# Patient Record
Sex: Male | Born: 1960 | Race: White | Hispanic: No | Marital: Married | Smoking: Never smoker
Health system: Southern US, Community
[De-identification: ages and names within clinical notes are randomized; demographics above are authoritative.]

## PROBLEM LIST (undated history)

## (undated) DIAGNOSIS — T7840XA Allergy, unspecified, initial encounter: Secondary | ICD-10-CM

## (undated) DIAGNOSIS — G5600 Carpal tunnel syndrome, unspecified upper limb: Secondary | ICD-10-CM

## (undated) DIAGNOSIS — J45909 Unspecified asthma, uncomplicated: Secondary | ICD-10-CM

## (undated) DIAGNOSIS — K219 Gastro-esophageal reflux disease without esophagitis: Secondary | ICD-10-CM

## (undated) HISTORY — DX: Gastro-esophageal reflux disease without esophagitis: K21.9

## (undated) HISTORY — DX: Unspecified asthma, uncomplicated: J45.909

## (undated) HISTORY — DX: Allergy, unspecified, initial encounter: T78.40XA

## (undated) HISTORY — DX: Carpal tunnel syndrome, unspecified upper limb: G56.00

---

## 2010-08-04 ENCOUNTER — Ambulatory Visit: Payer: Self-pay

## 2010-11-16 ENCOUNTER — Ambulatory Visit: Payer: Self-pay | Admitting: Internal Medicine

## 2012-05-05 ENCOUNTER — Ambulatory Visit: Payer: Self-pay | Admitting: Unknown Physician Specialty

## 2015-03-04 ENCOUNTER — Other Ambulatory Visit: Payer: Self-pay | Admitting: Specialist

## 2015-03-04 DIAGNOSIS — R911 Solitary pulmonary nodule: Secondary | ICD-10-CM

## 2015-03-07 ENCOUNTER — Ambulatory Visit: Admission: RE | Admit: 2015-03-07 | Payer: 59 | Source: Ambulatory Visit

## 2015-03-11 ENCOUNTER — Ambulatory Visit
Admission: RE | Admit: 2015-03-11 | Discharge: 2015-03-11 | Disposition: A | Discharge status: Home / Self Care | Payer: 59 | Source: Ambulatory Visit | Attending: Specialist | Admitting: Specialist

## 2015-03-11 DIAGNOSIS — R0902 Hypoxemia: Secondary | ICD-10-CM | POA: Diagnosis not present

## 2015-03-11 DIAGNOSIS — R05 Cough: Secondary | ICD-10-CM | POA: Insufficient documentation

## 2015-03-11 DIAGNOSIS — R0602 Shortness of breath: Secondary | ICD-10-CM | POA: Diagnosis not present

## 2015-03-11 DIAGNOSIS — R911 Solitary pulmonary nodule: Secondary | ICD-10-CM | POA: Diagnosis not present

## 2015-03-20 ENCOUNTER — Other Ambulatory Visit: Payer: Self-pay | Admitting: Specialist

## 2015-03-20 DIAGNOSIS — R918 Other nonspecific abnormal finding of lung field: Secondary | ICD-10-CM

## 2015-09-17 ENCOUNTER — Ambulatory Visit: Payer: 59 | Attending: Specialist

## 2015-10-10 ENCOUNTER — Other Ambulatory Visit: Payer: Self-pay | Admitting: Specialist

## 2015-10-10 DIAGNOSIS — J479 Bronchiectasis, uncomplicated: Secondary | ICD-10-CM

## 2015-10-10 DIAGNOSIS — R918 Other nonspecific abnormal finding of lung field: Secondary | ICD-10-CM

## 2015-10-16 ENCOUNTER — Ambulatory Visit: Payer: 59 | Attending: Specialist

## 2016-01-01 ENCOUNTER — Encounter: Payer: Self-pay | Admitting: Physician Assistant

## 2016-01-01 ENCOUNTER — Encounter: Payer: Self-pay | Admitting: *Deleted

## 2016-01-01 ENCOUNTER — Ambulatory Visit (INDEPENDENT_AMBULATORY_CARE_PROVIDER_SITE_OTHER): Payer: 59 | Admitting: Physician Assistant

## 2016-01-01 VITALS — BP 110/70 | HR 65 | Temp 97.6°F | Resp 16 | Ht 69.0 in | Wt 164.0 lb

## 2016-01-01 DIAGNOSIS — J4520 Mild intermittent asthma, uncomplicated: Secondary | ICD-10-CM

## 2016-01-01 DIAGNOSIS — J302 Other seasonal allergic rhinitis: Secondary | ICD-10-CM

## 2016-01-01 DIAGNOSIS — J4530 Mild persistent asthma, uncomplicated: Secondary | ICD-10-CM | POA: Insufficient documentation

## 2016-01-01 DIAGNOSIS — J0100 Acute maxillary sinusitis, unspecified: Secondary | ICD-10-CM

## 2016-01-01 DIAGNOSIS — J3089 Other allergic rhinitis: Secondary | ICD-10-CM | POA: Insufficient documentation

## 2016-01-01 DIAGNOSIS — K219 Gastro-esophageal reflux disease without esophagitis: Secondary | ICD-10-CM | POA: Insufficient documentation

## 2016-01-01 MED ORDER — ALBUTEROL SULFATE (2.5 MG/3ML) 0.083% IN NEBU: 2.5000 mg | INHALATION_SOLUTION | Freq: Four times a day (QID) | RESPIRATORY_TRACT | Status: AC | PRN

## 2016-01-01 MED ORDER — AMOXICILLIN-POT CLAVULANATE 875-125 MG PO TABS
1.0000 | ORAL_TABLET | Freq: Two times a day (BID) | ORAL | Status: DC
Start: 2016-01-01 — End: 2016-09-09

## 2016-01-01 MED ORDER — FLUTICASONE PROPIONATE 50 MCG/ACT NA SUSP: 2.0000 | Freq: Every day | NASAL | Status: AC

## 2016-01-01 NOTE — Progress Notes (Signed)
Patient: Danny Ramos Male    DOB: 09/04/1961   55 y.o.   MRN: [id number] Visit Date: 01/01/2016  Today's Provider: Margaretann Loveless, PA-C   Chief Complaint  Patient presents with  . Sinusitis   Subjective:    Sinusitis This is a new ( ) problem. The current episode started 1 to 4 weeks ago (2 weeks). The problem has been gradually improving since onset. The maximum temperature recorded prior to his arrival was 101 - 101.9 F. The pain is mild. Associated symptoms include chills, congestion, coughing, ear pain, headaches, shortness of breath, sinus pressure, sneezing and a sore throat. Pertinent negatives include no diaphoresis, hoarse voice, neck pain or swollen glands. (Has tube in right ear) Past treatments include oral decongestants and spray decongestants (flonase, tylenol cold and flu). The treatment provided mild relief.   Sinus pressure for 2 weeks. Chills, sweats, and fever since Saturday 12/27/2015. Right ear feels congested, headaches, sob, sore throat.   No Known Allergies Previous Medications   ASPIRIN 81 MG TABLET    Take 1 tablet by mouth daily.   FLUTICASONE (FLONASE) 50 MCG/ACT NASAL SPRAY    Place into the nose.   FLUTICASONE-SALMETEROL (ADVAIR DISKUS) 250-50 MCG/DOSE AEPB    Inhale into the lungs.   SPIRIVA HANDIHALER 18 MCG INHALATION CAPSULE        Review of Systems  Constitutional: Positive for chills. Negative for fever, diaphoresis and appetite change.  HENT: Positive for congestion, ear pain, postnasal drip, sinus pressure, sneezing and sore throat. Negative for hoarse voice.   Respiratory: Positive for cough and shortness of breath. Negative for chest tightness and wheezing.   Cardiovascular: Negative for chest pain and palpitations.  Gastrointestinal: Negative for nausea, vomiting and abdominal pain.  Musculoskeletal: Negative for neck pain.  Neurological: Positive for headaches. Negative for dizziness.    Social History  Substance Use Topics   . Smoking status: Never Smoker   . Smokeless tobacco: Not on file  . Alcohol Use: 0.0 oz/week    0 Standard drinks or equivalent per week   Objective:   BP 110/70 mmHg  Pulse 65  Temp(Src) 97.6 F (36.4 C) (Oral)  Resp 16  Ht  (1.753 m)  Wt 164 lb (74.39 kg)  BMI 24.21 kg/m2  SpO2 96%  Physical Exam  Constitutional: He appears well-developed and well-nourished. No distress.  HENT:  Head: Normocephalic and atraumatic.  Right Ear: Hearing, external ear and ear canal normal. Tympanic membrane is not erythematous and not bulging. No middle ear effusion (small cerumen blocking lower aspect of TM so tube was not visualized).  Left Ear: Hearing, external ear and ear canal normal. Tympanic membrane is not erythematous and not bulging. A middle ear effusion is present.  Nose: Mucosal edema and rhinorrhea present. Right sinus exhibits maxillary sinus tenderness. Right sinus exhibits no frontal sinus tenderness. Left sinus exhibits maxillary sinus tenderness. Left sinus exhibits no frontal sinus tenderness.  Mouth/Throat: Uvula is midline, oropharynx is clear and moist and mucous membranes are normal. No oropharyngeal exudate, posterior oropharyngeal edema or posterior oropharyngeal erythema (postnasal drainage noted-thick).  Eyes: Conjunctivae and EOM are normal. Pupils are equal, round, and reactive to light. Right eye exhibits no discharge. Left eye exhibits no discharge.  Neck: Normal range of motion. Neck supple. No tracheal deviation present. No Brudzinski's sign and no Kernig's sign noted. No thyromegaly present.  Cardiovascular: Normal rate, regular rhythm and normal heart sounds.  Exam reveals no gallop  and no friction rub.   No murmur heard. Pulmonary/Chest: Effort normal and breath sounds normal. No stridor. No respiratory distress. He has no wheezes. He has no rales.  Lymphadenopathy:    He has no cervical adenopathy.  Skin: Skin is warm and dry. He is not diaphoretic.  Vitals  reviewed.       Assessment & Plan:     1. Acute maxillary sinusitis, recurrence not specified Worsening symptoms over 2 weeks that has not responded to OTC medications. Will give augmentin as below. Continue allergy medications as prescribed. Stay well hydrated and get plenty of rest. Call if no improvement in symptoms.  - amoxicillin-clavulanate (AUGMENTIN) 875-125 MG tablet; Take 1 tablet by mouth 2 (two) times daily.  Dispense: 20 tablet; Refill: 0  2. Asthma, mild intermittent, uncomplicated Stable. Diagnosis pulled for medication refill. Continue current medical treatment plan. - albuterol (PROVENTIL HFA;VENTOLIN HFA) 108 (90 Base) MCG/ACT inhaler; Inhale 2 puffs into the lungs every 6 (six) hours as needed for wheezing or shortness of breath.  Dispense: 1 Inhaler; Refill: 0 - albuterol (PROVENTIL) (2.5 MG/3ML) 0.083% nebulizer solution; Take 3 mLs (2.5 mg total) by nebulization every 6 (six) hours as needed for wheezing or shortness of breath.  Dispense: 75 mL; Refill: 12  3. Other seasonal allergic rhinitis Stable. Diagnosis pulled for medication refill. Continue current medical treatment plan. - fluticasone (FLONASE) 50 MCG/ACT nasal spray; Place 2 sprays into both nostrils daily.  Dispense: 16 g; Refill: 6       Margaretann LovelessJennifer M Rana Adorno, PA-C  [facility]

## 2016-01-01 NOTE — Patient Instructions (Signed)

## 2016-02-11 ENCOUNTER — Telehealth: Payer: Self-pay

## 2016-02-11 ENCOUNTER — Other Ambulatory Visit: Payer: Self-pay | Admitting: Family Medicine

## 2016-02-11 DIAGNOSIS — J4520 Mild intermittent asthma, uncomplicated: Secondary | ICD-10-CM

## 2016-02-11 DIAGNOSIS — J4530 Mild persistent asthma, uncomplicated: Secondary | ICD-10-CM

## 2016-02-11 MED ORDER — FLUTICASONE-SALMETEROL 250-50 MCG/DOSE IN AEPB: 1.0000 | INHALATION_SPRAY | Freq: Two times a day (BID) | RESPIRATORY_TRACT | Status: DC

## 2016-02-11 MED ORDER — ALBUTEROL SULFATE HFA 108 (90 BASE) MCG/ACT IN AERS: 2.0000 | INHALATION_SPRAY | Freq: Four times a day (QID) | RESPIRATORY_TRACT | Status: DC | PRN

## 2016-02-11 MED ORDER — ALBUTEROL SULFATE HFA 108 (90 BASE) MCG/ACT IN AERS: 2.0000 | INHALATION_SPRAY | Freq: Four times a day (QID) | RESPIRATORY_TRACT | Status: AC | PRN

## 2016-02-11 NOTE — Telephone Encounter (Signed)
Patient is requesting refill on Advair and ProAir rescue inhaler. He is completely out of the Advair and would like it sent to local pharmacy. He would also like a refill sent into mail order pharmacy if possible. Thanks!

## 2016-09-09 ENCOUNTER — Ambulatory Visit (INDEPENDENT_AMBULATORY_CARE_PROVIDER_SITE_OTHER): Payer: 59 | Admitting: Family Medicine

## 2016-09-09 ENCOUNTER — Encounter: Payer: Self-pay | Admitting: Family Medicine

## 2016-09-09 ENCOUNTER — Other Ambulatory Visit: Payer: Self-pay | Admitting: Family Medicine

## 2016-09-09 VITALS — BP 104/72 | HR 72 | Temp 97.8°F | Resp 16 | Wt 165.0 lb

## 2016-09-09 DIAGNOSIS — J069 Acute upper respiratory infection, unspecified: Secondary | ICD-10-CM | POA: Diagnosis not present

## 2016-09-09 DIAGNOSIS — J441 Chronic obstructive pulmonary disease with (acute) exacerbation: Secondary | ICD-10-CM

## 2016-09-09 DIAGNOSIS — B9789 Other viral agents as the cause of diseases classified elsewhere: Secondary | ICD-10-CM

## 2016-09-09 DIAGNOSIS — J449 Chronic obstructive pulmonary disease, unspecified: Secondary | ICD-10-CM | POA: Insufficient documentation

## 2016-09-09 MED ORDER — AMOXICILLIN-POT CLAVULANATE 875-125 MG PO TABS: 1.0000 | ORAL_TABLET | Freq: Two times a day (BID) | ORAL | 0 refills | Status: DC

## 2016-09-09 MED ORDER — PREDNISONE 20 MG PO TABS
ORAL_TABLET | ORAL | 0 refills | Status: DC
Start: 2016-09-09 — End: 2016-09-09

## 2016-09-09 MED ORDER — PREDNISONE 20 MG PO TABS: ORAL_TABLET | ORAL | 0 refills | Status: DC

## 2016-09-09 NOTE — Progress Notes (Signed)
Subjective:     Patient ID: Danny NeighborsJohn A Ramos, male   DOB: 06-25-1961, 55 y.o.   MRN: [id number]  HPI  Chief Complaint  Patient presents with  . Sinus Problem    Patient comes in office today with concerns of sinus congestion and cough since 12/15. Patient reports that he has been wheezing, sneezing, having chills and difficulty breathing. Patient reports that he has taken otc Tylenol, Tylenol sinus and Robitussin  Reports increased thick, purulent sputum and shortness of breath. Has started using his nebulizer.   Review of Systems     Objective:   Physical Exam  Constitutional: He appears well-developed and well-nourished. No distress.  Ears: T.M's intact without inflammation Sinuses: mild maxillary sinus tenderness Throat: no tonsillar enlargement or exudate Neck: no cervical adenopathy Lungs: basilar expiratory wheezes     Assessment:    1. Viral upper respiratory tract infectio  2. COPD exacerbation (HCC) - amoxicillin-clavulanate (AUGMENTIN) 875-125 MG tablet; Take 1 tablet by mouth 2 (two) times daily.  Dispense: 20 tablet; Refill: 0 - predniSONE (DELTASONE) 20 MG tablet; Take two pills twice daily for 5 days  Dispense: 10 tablet; Refill: 0    Plan:    Continue expectorant and rescue inhalers.

## 2016-09-09 NOTE — Patient Instructions (Signed)
Continue with rescue inhaler/nebulizer at least twice daily while ill. Continue with expectorant

## 2017-04-13 ENCOUNTER — Other Ambulatory Visit: Payer: Self-pay | Admitting: Family Medicine

## 2017-04-13 DIAGNOSIS — J4530 Mild persistent asthma, uncomplicated: Secondary | ICD-10-CM

## 2018-05-11 ENCOUNTER — Other Ambulatory Visit: Payer: Self-pay | Admitting: Family Medicine

## 2018-05-11 DIAGNOSIS — J4530 Mild persistent asthma, uncomplicated: Secondary | ICD-10-CM

## 2018-05-11 MED ORDER — FLUTICASONE-SALMETEROL 250-50 MCG/DOSE IN AEPB: INHALATION_SPRAY | RESPIRATORY_TRACT | 3 refills | Status: DC

## 2018-11-03 ENCOUNTER — Ambulatory Visit: Payer: 59 | Admitting: Family Medicine

## 2018-11-03 ENCOUNTER — Encounter: Payer: Self-pay | Admitting: Family Medicine

## 2018-11-03 ENCOUNTER — Other Ambulatory Visit: Payer: Self-pay | Admitting: Family Medicine

## 2018-11-03 ENCOUNTER — Other Ambulatory Visit: Payer: Self-pay

## 2018-11-03 VITALS — BP 110/70 | HR 71 | Temp 97.6°F | Ht 69.0 in | Wt 165.8 lb

## 2018-11-03 DIAGNOSIS — Z87898 Personal history of other specified conditions: Secondary | ICD-10-CM | POA: Diagnosis not present

## 2018-11-03 DIAGNOSIS — J4530 Mild persistent asthma, uncomplicated: Secondary | ICD-10-CM

## 2018-11-03 DIAGNOSIS — J3089 Other allergic rhinitis: Secondary | ICD-10-CM

## 2018-11-03 DIAGNOSIS — J449 Chronic obstructive pulmonary disease, unspecified: Secondary | ICD-10-CM

## 2018-11-03 DIAGNOSIS — M722 Plantar fascial fibromatosis: Secondary | ICD-10-CM

## 2018-11-03 DIAGNOSIS — R5383 Other fatigue: Secondary | ICD-10-CM

## 2018-11-03 DIAGNOSIS — Z125 Encounter for screening for malignant neoplasm of prostate: Secondary | ICD-10-CM

## 2018-11-03 NOTE — Patient Instructions (Signed)
You will get a call about the allergist and podiatry appointments. We will call about lab and CT scan results. Consider getting a colonoscopy for colon cancer screening. Consider establishing with either Adriana Pollak P.A. or Shirlee Latch M.D.

## 2018-11-03 NOTE — Progress Notes (Signed)
  Subjective:     Patient ID: Danny Ramos, male   DOB: 03/22/1961, 58 y.o.   MRN: [id number] Chief Complaint  Patient presents with  . Medication Refill  . right foot pain    no injury just started having pain for several months now   HPI Last seen here in 2017, he has also been lost to f/u by his pulmonary MD, Dr. Meredeth Ide. Has not had f/u of pulmonary nodules noted on chest CT 2016. Reports fatigue and wishes further evaluation. Also has been using nsaid's and shoe inserts for foot pain c/w plantar fasciitis for 3 months without improvement. Review of Systems  Respiratory: Negative for shortness of breath.        Reports control of asthma and COPD with his maintenance medication. States he has poor control of allergies and wishes further evaluation.  Cardiovascular: Negative for chest pain and palpitations.       Objective:   Physical Exam Constitutional:      General: He is not in acute distress.    Appearance: He is not ill-appearing.  Cardiovascular:     Rate and Rhythm: Normal rate and regular rhythm.     Pulses:          Dorsalis pedis pulses are 2+ on the right side.       Posterior tibial pulses are 2+ on the right side.  Musculoskeletal:     Right lower leg: No edema.     Left lower leg: No edema.     Comments: Right plantar foot tender at midfoot. Ankle ligaments stable. DF/PF 5/5  Neurological:     Mental Status: He is alert.        Assessment:    1. Mild persistent asthma without complication: stable  2. Chronic obstructive pulmonary disease, unspecified COPD type (HCC): stable  3. History of multiple pulmonary nodules - CT CHEST NODULE FOLLOW UP LOW DOSE W/O; Future  4. Perennial allergic rhinitis - Ambulatory referral to Allergy  5. Plantar fasciitis of right foot - Ambulatory referral to Podiatry  6. Fatigue, unspecified type - CBC with Differential/Platelet - T4, free - TSH - Comprehensive metabolic panel  7. Screening for prostate  cancer - PSA    Plan:    Further f/u pending lab and scan results. Discussed establishing with Danny Ramos or Danny Ramos due to my retirement.

## 2018-11-04 LAB — CBC WITH DIFFERENTIAL/PLATELET
BASOS ABS: 0 10*3/uL (ref 0.0–0.2)
BASOS: 1 %
EOS (ABSOLUTE): 0.2 10*3/uL (ref 0.0–0.4)
Eos: 2 %
Hematocrit: 42.3 % (ref 37.5–51.0)
Hemoglobin: 14.5 g/dL (ref 13.0–17.7)
IMMATURE GRANS (ABS): 0 10*3/uL (ref 0.0–0.1)
IMMATURE GRANULOCYTES: 0 %
Lymphocytes Absolute: 1.5 10*3/uL (ref 0.7–3.1)
Lymphs: 22 %
MCH: 30.3 pg (ref 26.6–33.0)
MCHC: 34.3 g/dL (ref 31.5–35.7)
MCV: 89 fL (ref 79–97)
Monocytes Absolute: 0.7 10*3/uL (ref 0.1–0.9)
Monocytes: 10 %
Neutrophils Absolute: 4.5 10*3/uL (ref 1.4–7.0)
Neutrophils: 65 %
PLATELETS: 257 10*3/uL (ref 150–450)
RBC: 4.78 x10E6/uL (ref 4.14–5.80)
RDW: 12.1 % (ref 11.6–15.4)
WBC: 6.9 10*3/uL (ref 3.4–10.8)

## 2018-11-04 LAB — COMPREHENSIVE METABOLIC PANEL
ALK PHOS: 78 IU/L (ref 39–117)
ALT: 21 IU/L (ref 0–44)
AST: 21 IU/L (ref 0–40)
Albumin/Globulin Ratio: 1.4 (ref 1.2–2.2)
Albumin: 4.2 g/dL (ref 3.8–4.9)
BILIRUBIN TOTAL: 0.9 mg/dL (ref 0.0–1.2)
BUN/Creatinine Ratio: 16 (ref 9–20)
BUN: 12 mg/dL (ref 6–24)
CHLORIDE: 101 mmol/L (ref 96–106)
CO2: 24 mmol/L (ref 20–29)
CREATININE: 0.77 mg/dL (ref 0.76–1.27)
Calcium: 9.5 mg/dL (ref 8.7–10.2)
GFR calc Af Amer: 117 mL/min/{1.73_m2} (ref >59)
GFR calc non Af Amer: 101 mL/min/{1.73_m2} (ref >59)
GLUCOSE: 82 mg/dL (ref 65–99)
Globulin, Total: 3 g/dL (ref 1.5–4.5)
Potassium: 4.4 mmol/L (ref 3.5–5.2)
Sodium: 140 mmol/L (ref 134–144)
Total Protein: 7.2 g/dL (ref 6.0–8.5)

## 2018-11-04 LAB — T4, FREE: FREE T4: 1.16 ng/dL (ref 0.82–1.77)

## 2018-11-04 LAB — TSH: TSH: 2.44 u[IU]/mL (ref 0.450–4.500)

## 2018-11-04 LAB — PSA: PROSTATE SPECIFIC AG, SERUM: 0.4 ng/mL (ref 0.0–4.0)

## 2018-11-06 ENCOUNTER — Telehealth: Payer: Self-pay

## 2018-11-06 NOTE — Telephone Encounter (Signed)
-----   Message from Anola Gurney, [place] sent at 11/06/2018  7:24 AM EST ----- Labs are all great.  Your fatigue may be contributed to by poorly controlled allergies so follow up with the allergist. Also would proceed with the chest CT scan as ordered.

## 2018-11-06 NOTE — Telephone Encounter (Signed)
Patient has been advised. KW 

## 2018-11-06 NOTE — Telephone Encounter (Signed)
[id number] 

## 2018-11-06 NOTE — Telephone Encounter (Signed)
Tried calling patient. Left message to call back. 

## 2018-11-06 NOTE — Telephone Encounter (Signed)
Pt returned missed call.  Please try pt back.  Thanks, [employer]

## 2018-11-15 ENCOUNTER — Ambulatory Visit
Admission: RE | Admit: 2018-11-15 | Discharge: 2018-11-15 | Disposition: A | Discharge status: Home / Self Care | Payer: 59 | Source: Ambulatory Visit | Attending: Family Medicine | Admitting: Family Medicine

## 2018-11-15 DIAGNOSIS — Z87898 Personal history of other specified conditions: Secondary | ICD-10-CM | POA: Insufficient documentation

## 2018-11-16 ENCOUNTER — Telehealth: Payer: Self-pay

## 2018-11-16 NOTE — Telephone Encounter (Signed)
Patient advised.KW 

## 2018-11-16 NOTE — Telephone Encounter (Signed)
-----   Message from Anola Gurney, [place] sent at 11/16/2018  9:37 AM EST ----- Nodules have resolved. Would recommend re-establish with your pulmonary doctor due to your emphyesema.

## 2018-12-01 ENCOUNTER — Ambulatory Visit (INDEPENDENT_AMBULATORY_CARE_PROVIDER_SITE_OTHER): Payer: 59

## 2018-12-01 ENCOUNTER — Ambulatory Visit: Payer: 59 | Admitting: Podiatry

## 2018-12-01 ENCOUNTER — Encounter: Payer: Self-pay | Admitting: Podiatry

## 2018-12-01 ENCOUNTER — Telehealth: Payer: Self-pay | Admitting: *Deleted

## 2018-12-01 ENCOUNTER — Other Ambulatory Visit: Payer: Self-pay

## 2018-12-01 DIAGNOSIS — M722 Plantar fascial fibromatosis: Secondary | ICD-10-CM

## 2018-12-01 MED ORDER — METHYLPREDNISOLONE 4 MG PO TBPK: ORAL_TABLET | ORAL | 0 refills | Status: DC

## 2018-12-01 MED ORDER — METHYLPREDNISOLONE 4 MG PO TBPK: ORAL_TABLET | ORAL | 0 refills | Status: AC

## 2018-12-01 MED ORDER — MELOXICAM 15 MG PO TABS: 15.0000 mg | ORAL_TABLET | Freq: Every day | ORAL | 1 refills | Status: DC

## 2018-12-01 NOTE — Telephone Encounter (Signed)
Left message ordering Medrol Dosepak 4mg .

## 2018-12-04 NOTE — Progress Notes (Signed)
   Subjective: 58 year old male presenting today as a new patient with a chief complaint of stabbing pain to the arch of the right foot that began 3 months ago. He reports feeling like he is walking on pins and needles. Walking increases the pain. He has not done anything for treatment. Patient is here for further evaluation and treatment.   Past Medical History:  Diagnosis Date  . Allergy   . Asthma   . Carpal tunnel syndrome   . GERD (gastroesophageal reflux disease)      Objective: Physical Exam General: The patient is alert and oriented x3 in no acute distress.  Dermatology: Skin is warm, dry and supple bilateral lower extremities. Negative for open lesions or macerations bilateral.   Vascular: Dorsalis Pedis and Posterior Tibial pulses palpable bilateral.  Capillary fill time is immediate to all digits.  Neurological: Epicritic and protective threshold intact bilateral.   Musculoskeletal: Tenderness to palpation to the plantar aspect of the right heel along the plantar fascia. All other joints range of motion within normal limits bilateral. Strength 5/5 in all groups bilateral.   Radiographic exam: Normal osseous mineralization. Joint spaces preserved. No fracture/dislocation/boney destruction. No other soft tissue abnormalities or radiopaque foreign bodies.   Assessment: 1. Plantar fasciitis right - midsubstance  2. Pain in right foot  Plan of Care:  1. Patient evaluated. Xrays reviewed.   2. Declined injections today.  3. Rx for Medrol Dose Pack placed 4. Rx for Meloxicam ordered for patient. 5. Plantar fascial band(s) dispensed 6. Instructed patient regarding therapies and modalities at home to alleviate symptoms.  7. Return to clinic in 4 weeks.    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    Felecia Shelling, DPM [facility]  Dr. Felecia Shelling, DPM    [address] [place], [place] [address]                Office  [phone]  Fax [phone]

## 2018-12-22 ENCOUNTER — Ambulatory Visit: Payer: 59 | Admitting: Podiatry

## 2019-03-12 ENCOUNTER — Other Ambulatory Visit: Payer: Self-pay | Admitting: Podiatry

## 2019-06-27 ENCOUNTER — Other Ambulatory Visit: Payer: Self-pay | Admitting: Family Medicine

## 2019-06-27 DIAGNOSIS — J4530 Mild persistent asthma, uncomplicated: Secondary | ICD-10-CM

## 2019-06-27 MED ORDER — FLUTICASONE-SALMETEROL 250-50 MCG/DOSE IN AEPB: INHALATION_SPRAY | RESPIRATORY_TRACT | 1 refills | Status: AC

## 2019-06-27 NOTE — Telephone Encounter (Signed)
Can we look in [place] for colon cancer screening and abstract if there please? Patient will need follow up around next February for physical/chronic for more refills.

## 2019-06-27 NOTE — Telephone Encounter (Signed)
[facility] faxed refill request for the following medications: ° °Fluticasone-Salmeterol (ADVAIR DISKUS) 250-50 MCG/DOSE AEPB ° ° °Please advise. °

## 2019-06-27 NOTE — Telephone Encounter (Signed)
No colonoscopy report in health data. I called [place] and they did not find any report of procedure being done.

## 2020-01-26 ENCOUNTER — Other Ambulatory Visit: Payer: Self-pay | Admitting: Physician Assistant

## 2020-01-26 DIAGNOSIS — J4530 Mild persistent asthma, uncomplicated: Secondary | ICD-10-CM

## 2020-01-26 NOTE — Telephone Encounter (Signed)
Requested medications are due for refill today?  Yes  Requested medications are on active medication list?  Yes ( in the form of Advair)  Last Refill:   Advair  06/27/2019 # 180 with one refill  Future visit scheduled?  No   Notes to Clinic:  Medication failed RX refill protocol due to no valid encounter in the last 12 months.  Patient's last visit 11/09/2018.

## 2020-02-11 ENCOUNTER — Other Ambulatory Visit: Payer: Self-pay | Admitting: Physician Assistant

## 2020-02-11 DIAGNOSIS — J4530 Mild persistent asthma, uncomplicated: Secondary | ICD-10-CM

## 2020-03-06 ENCOUNTER — Other Ambulatory Visit: Payer: Self-pay | Admitting: Unknown Physician Specialty

## 2020-03-06 DIAGNOSIS — H7191 Unspecified cholesteatoma, right ear: Secondary | ICD-10-CM

## 2020-03-18 ENCOUNTER — Ambulatory Visit: Payer: 59

## 2020-04-01 ENCOUNTER — Other Ambulatory Visit: Payer: Self-pay

## 2020-04-01 ENCOUNTER — Ambulatory Visit
Admission: RE | Admit: 2020-04-01 | Discharge: 2020-04-01 | Disposition: A | Discharge status: Home / Self Care | Payer: 59 | Source: Ambulatory Visit | Attending: Unknown Physician Specialty | Admitting: Unknown Physician Specialty

## 2020-04-01 DIAGNOSIS — H7191 Unspecified cholesteatoma, right ear: Secondary | ICD-10-CM | POA: Insufficient documentation

## 2020-12-26 IMAGING — CT CT CHEST W/O CM
2 of 4 series · 15 of 36 positions shown, 18 images · non-contrast
Comparison: 03/11/2015 chest CT

CLINICAL DATA: 57-year-old male for follow-up of pulmonary nodules.
History of smoking history and emphysema.

EXAM:
CT CHEST WITHOUT CONTRAST
TECHNIQUE: Multidetector CT imaging of the chest was performed following the
standard protocol without IV contrast.

[Series 2: chest · axial · 0.70mm/px · z∈[-1332,-1004]mm · 12 of 194 slices shown, 15 images (1 of 2)]
[im 15/194  mediastinal]
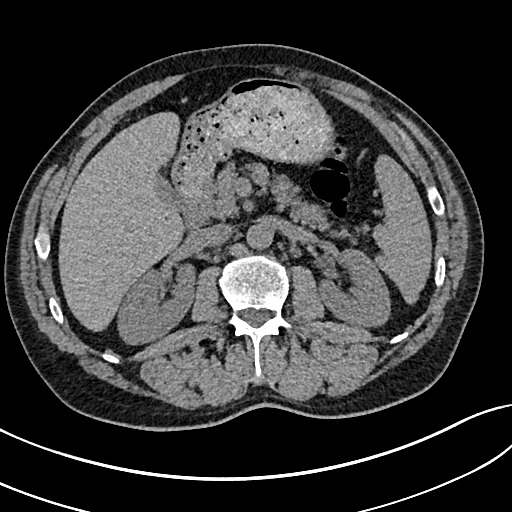
[im 15/194  lung]
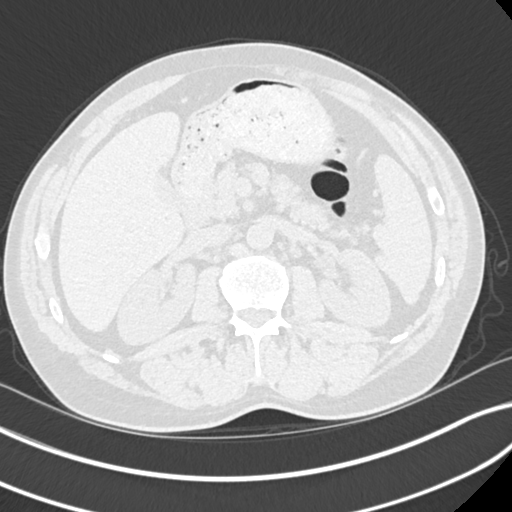
[im 30/194  lung]
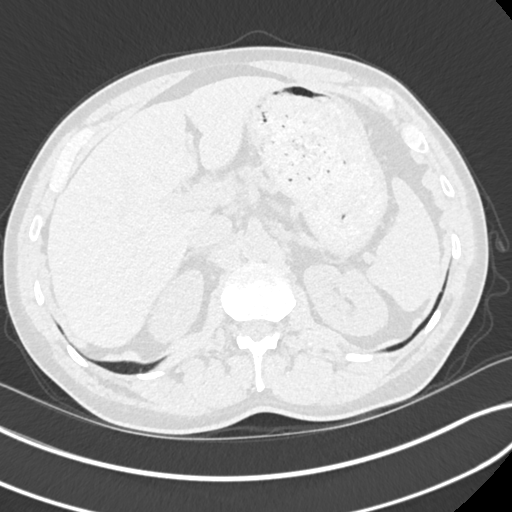
[im 45/194  lung]
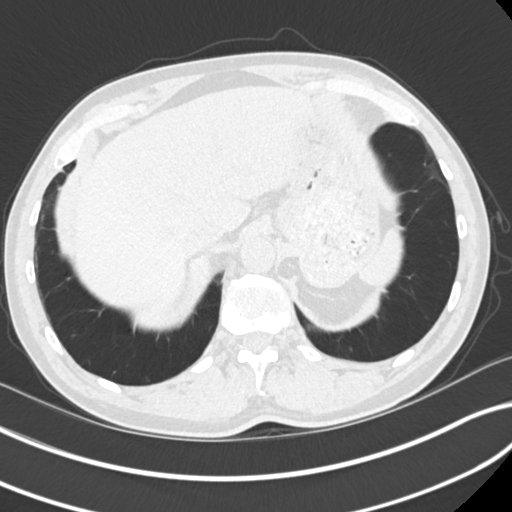
[im 60/194  lung]
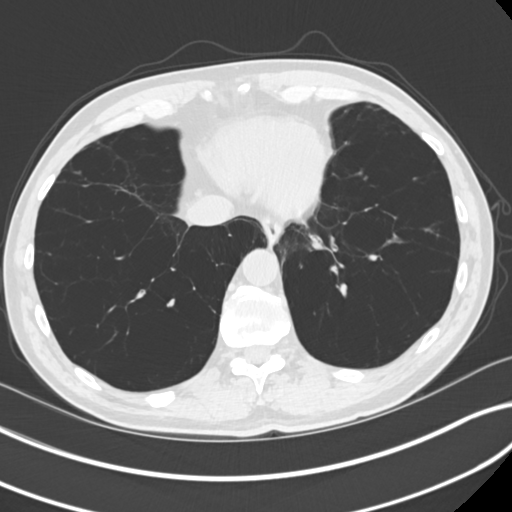
[im 75/194  mediastinal]
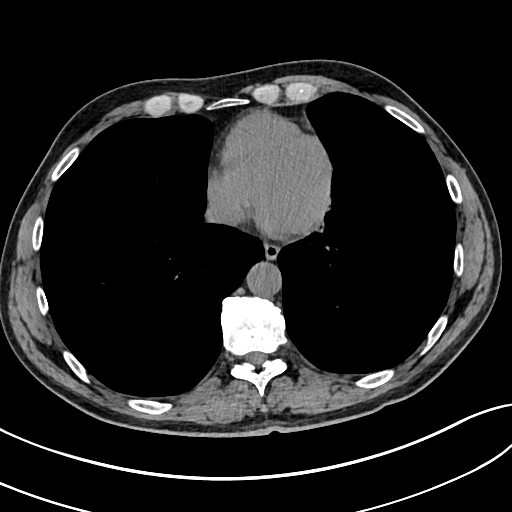
[im 75/194  lung]
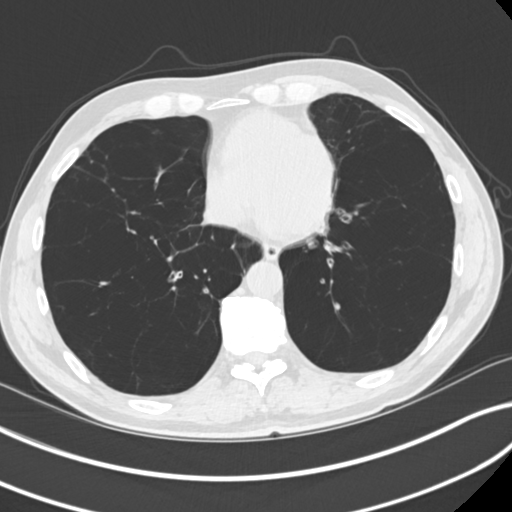
[im 90/194  lung]
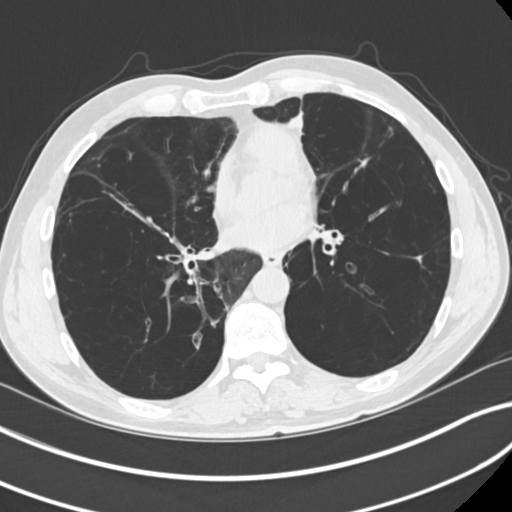
[im 104/194  lung]
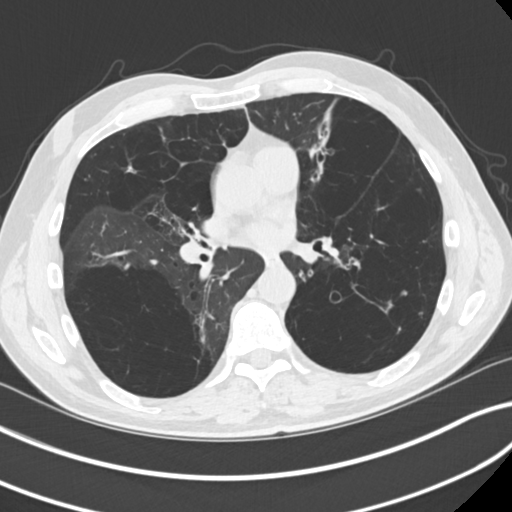
[im 119/194  lung]
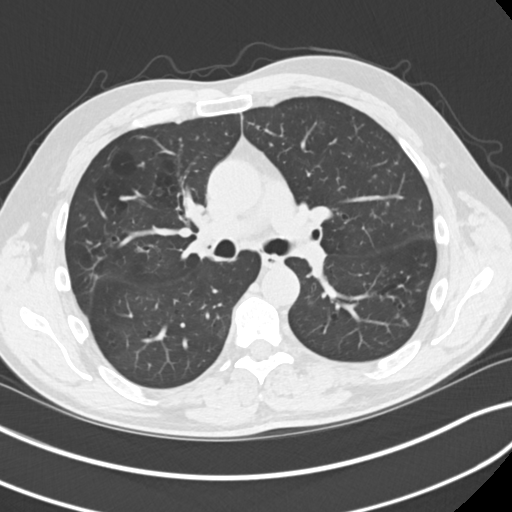
[im 134/194  mediastinal]
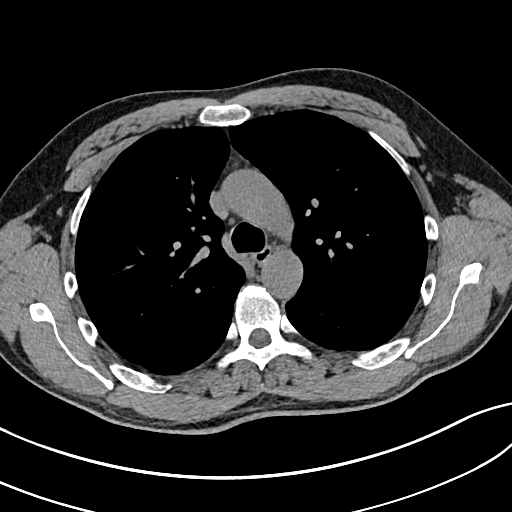
[im 134/194  lung]
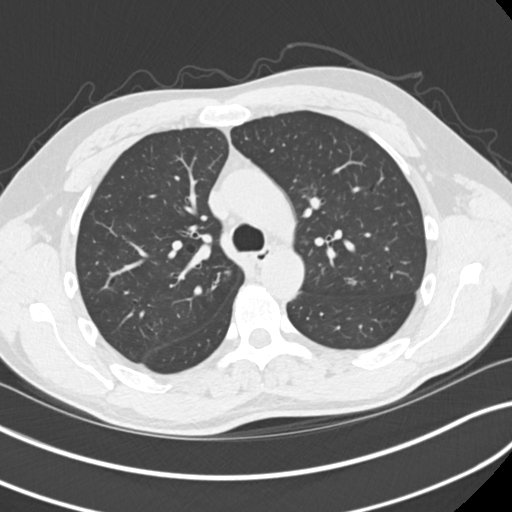
[im 149/194  lung]
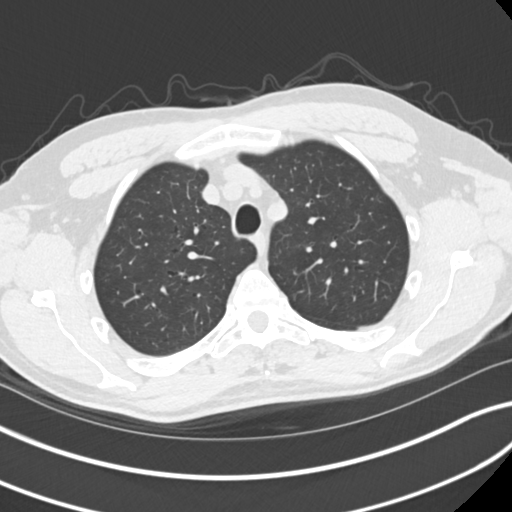
[im 164/194  lung]
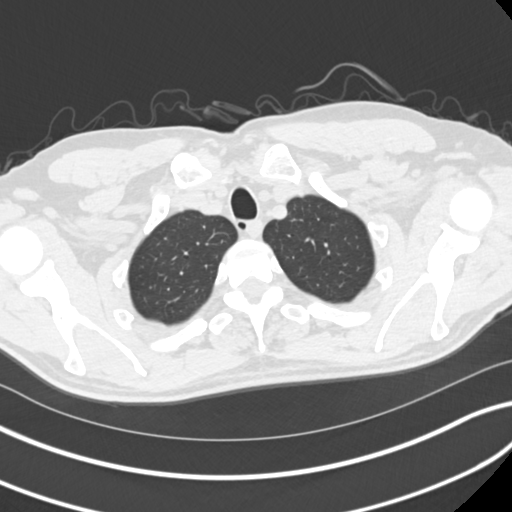
[im 179/194  lung]
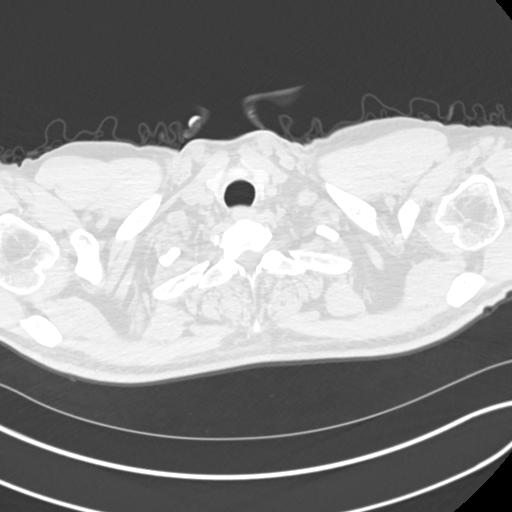

[Series 5: chest · coronal · 0.70mm/px · 3 of 142 slices shown (2 of 2)]
[im 29/142  lung]
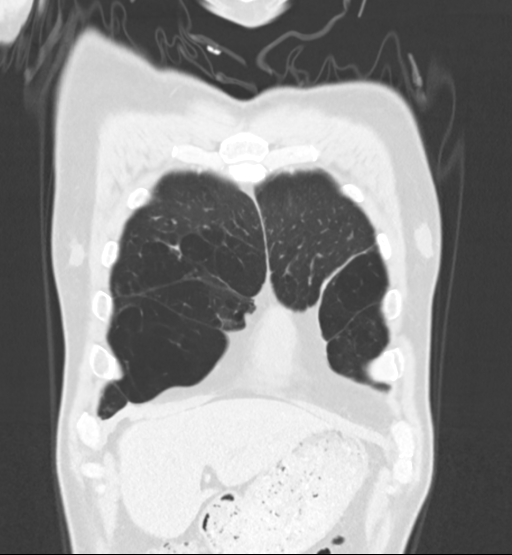
[im 57/142  lung]
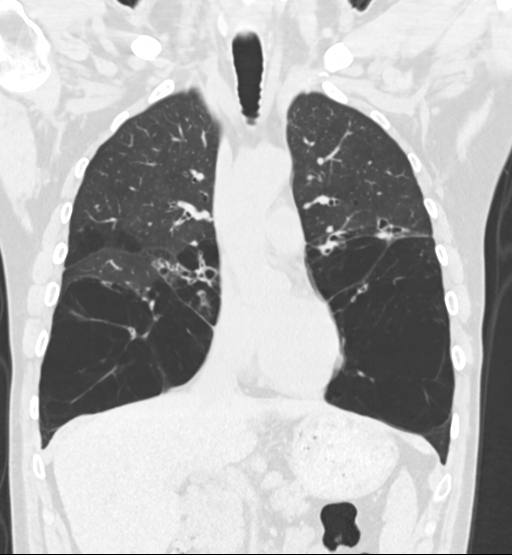
[im 85/142  lung]
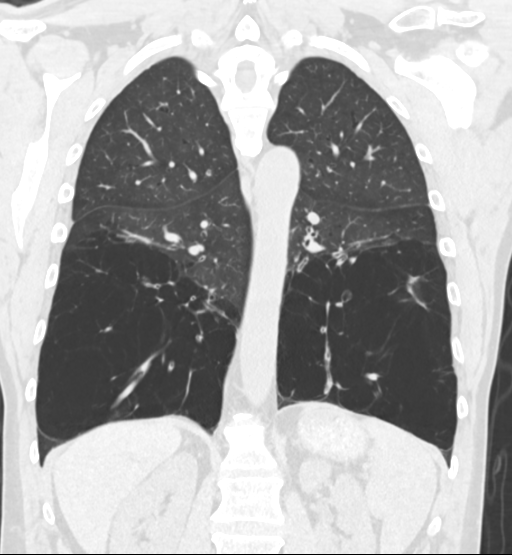

[15 of 36 positions shown; findings below may reference images not displayed]

FINDINGS: Cardiovascular: Heart size normal. Ectasia of the ascending thoracic
aorta noted measuring up to 3.7 cm in greatest diameter. No thoracic
aortic aneurysm or pericardial effusion.

Mediastinum/Nodes: No enlarged mediastinal or axillary lymph nodes.
Thyroid gland, trachea, and esophagus demonstrate no significant
findings.

Lungs/Pleura: Emphysema is again identified with a severe basilar
predominance. Cylindrical and cystic bronchiectasis again noted,
greatest in the mid and LOWER lungs. Diffuse peribronchial
thickening again identified. Scarring/chronic lingular atelectasis
and mild bilateral LOWER lung scarring again noted.

No suspicious or enlarging pulmonary nodule identified. Mucous
plugging in several LOWER lobe bronchi are identified.

No airspace disease, pleural effusion, pneumothorax or pulmonary
mass.

Upper Abdomen: No acute abnormality.

Musculoskeletal: No acute or suspicious bony abnormalities.
IMPRESSION: 1. Panlobular emphysema with severe basilar prominence, cylindrical
and cystic bronchiectasis and diffuse peribronchial thickening again
noted. Given this appearance, alpha 1 antitrypsin deficiency is
within the differential - correlate clinically.
2. No persistent suspicious nodules or pulmonary mass.
3. Mucus plugging within several LOWER lobe bronchi with unchanged
chronic scarring/atelectasis.
4. Ectatic ascending thoracic aorta without aneurysm.

## 2022-05-13 IMAGING — CT CT TEMPORAL BONES W/O CM
3 of 6 series · 15 of 40 positions shown, 18 images · non-contrast
Comparison: None.

CLINICAL DATA: Cholesteatoma right ear

EXAM:
CT TEMPORAL BONES WITHOUT CONTRAST
TECHNIQUE: Axial and coronal plane CT imaging of the petrous temporal bones was
performed with thin-collimation image reconstruction. No intravenous
contrast was administered. Multiplanar CT image reconstructions were
also generated.

[Series 3: ax soft temperal bones 2.00 · axial · 0.33mm/px · z∈[-534,-514]mm · 2 of 30 slices shown]
[im 10/30  brain]
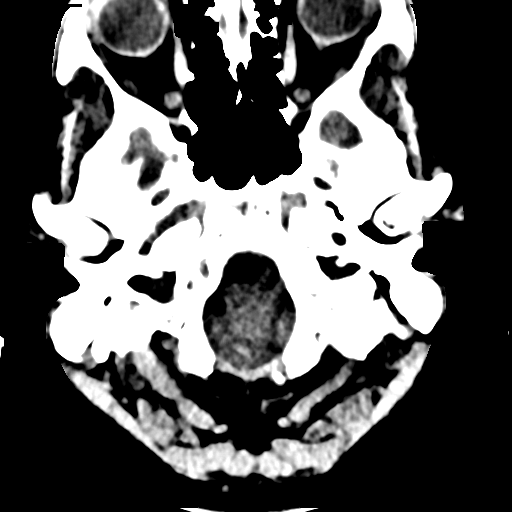
[im 20/30  brain]
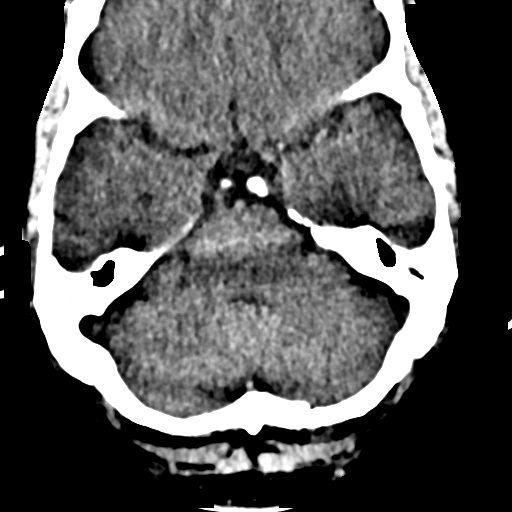

[Series 8: ax mag right temperal bones 0.60 · axial · 0.20mm/px · z∈[-548,-496]mm · 11 of 104 slices shown, 14 images]
[im 9/104  brain]
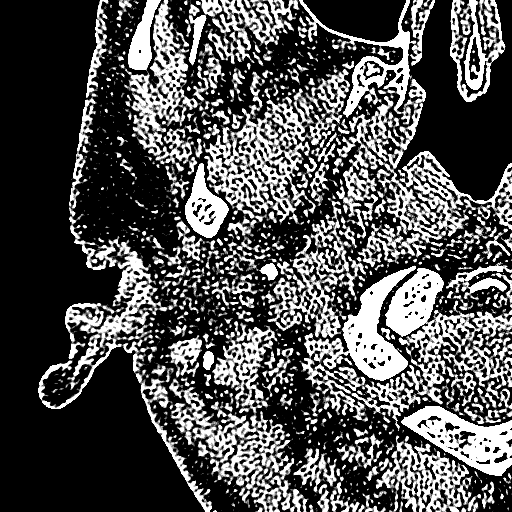
[im 9/104  bone]
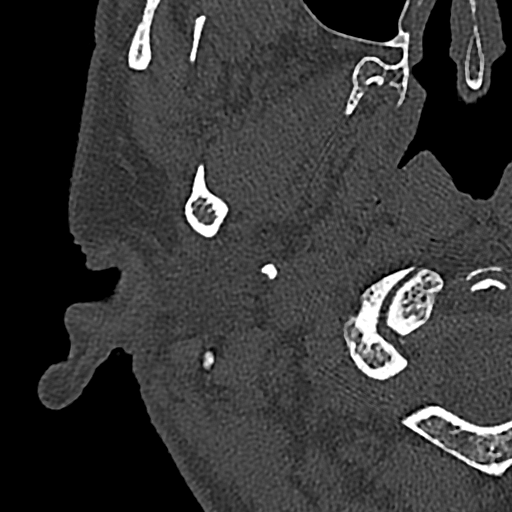
[im 18/104  bone]
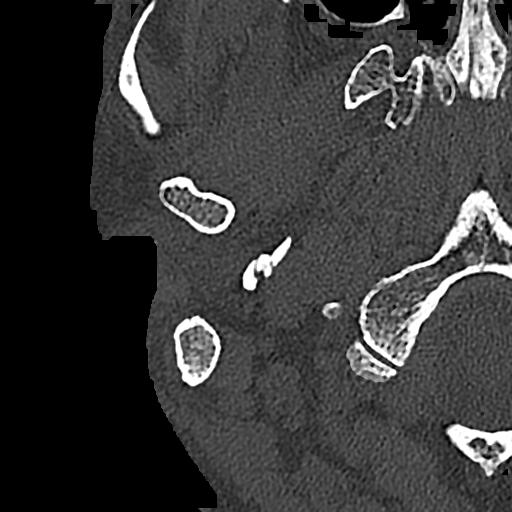
[im 26/104  bone]
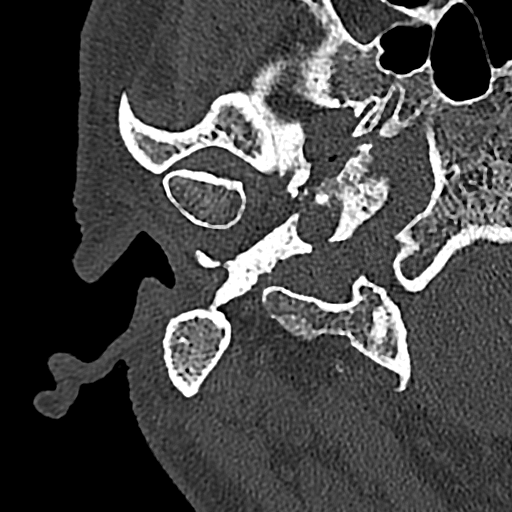
[im 35/104  bone]
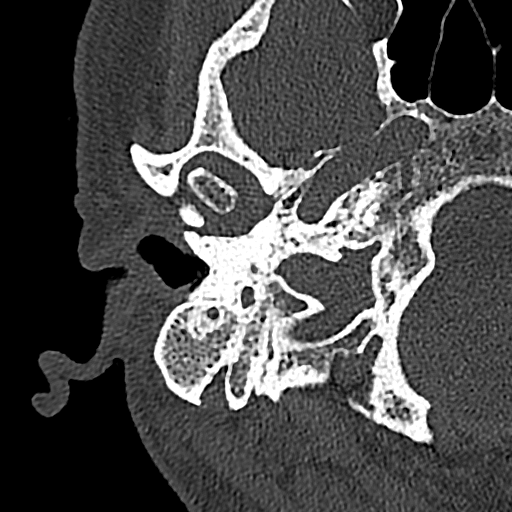
[im 43/104  brain]
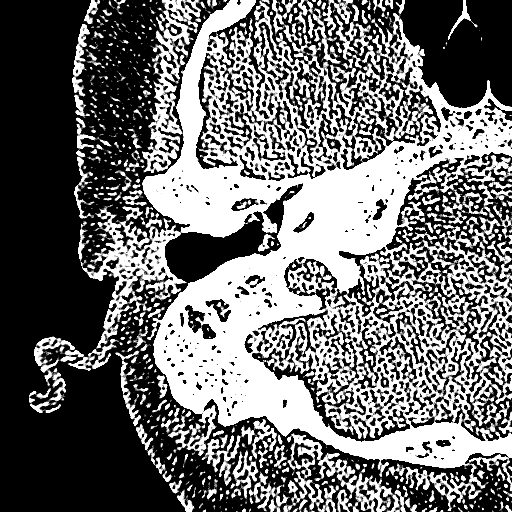
[im 43/104  bone]
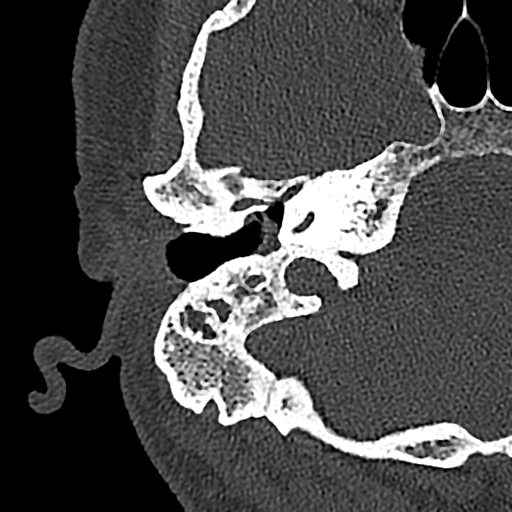
[im 52/104  bone]
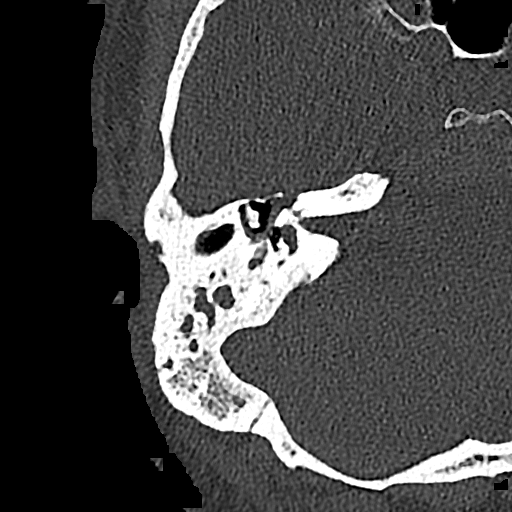
[im 61/104  bone]
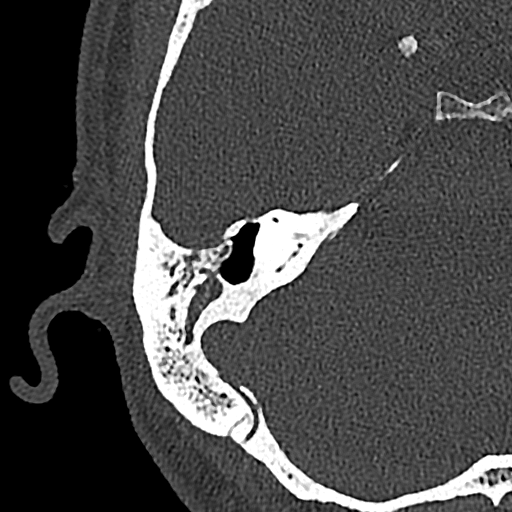
[im 69/104  bone]
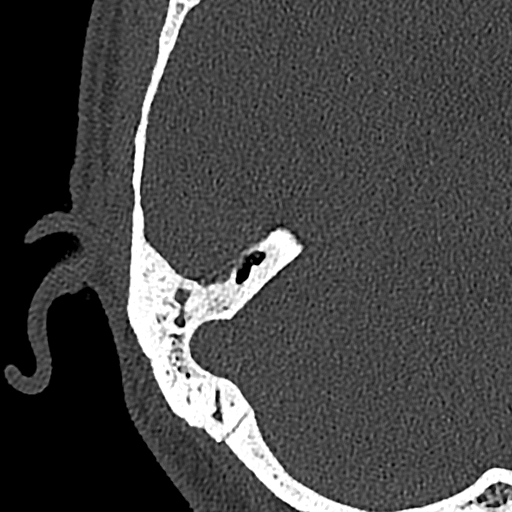
[im 78/104  brain]
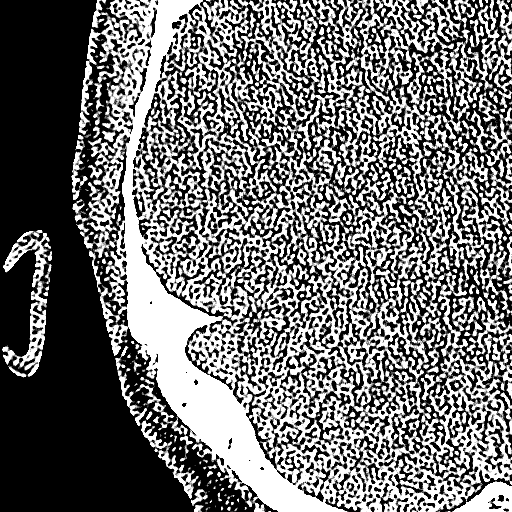
[im 78/104  bone]
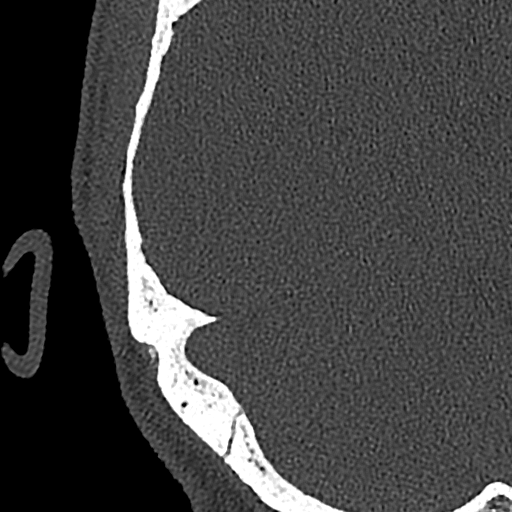
[im 86/104  bone]
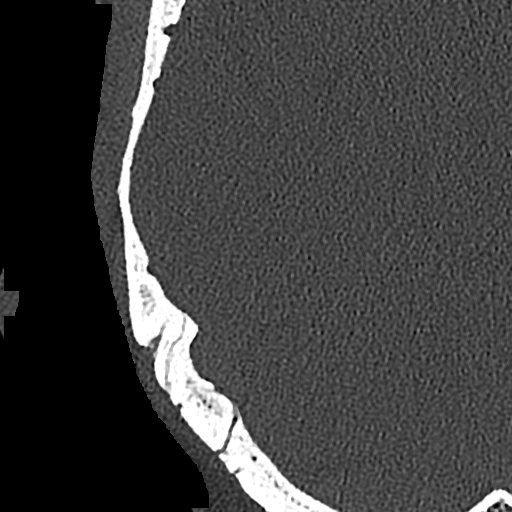
[im 95/104  bone]
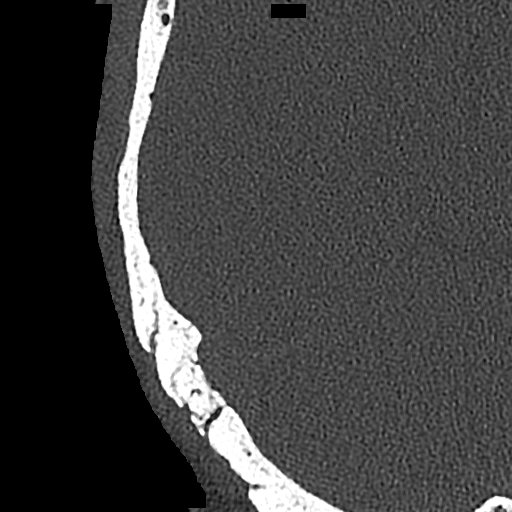

[Series 10: cor mag right temperal bones 0.80 cor · coronal · 0.12mm/px · 2 of 125 slices shown]
[im 42/125  bone]
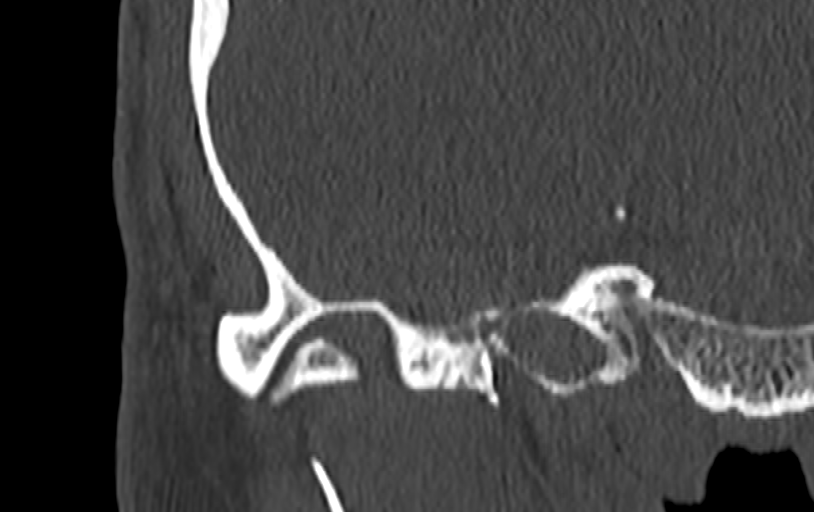
[im 83/125  bone]
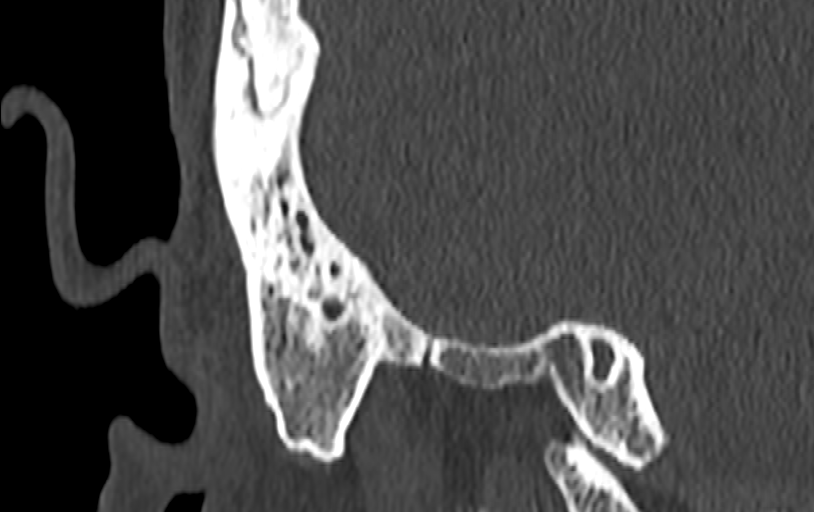

[15 of 40 positions shown; findings below may reference images not displayed]

FINDINGS: Right temporal bone:

External auditory canal is unremarkable. Tympanic membrane appears
perforated. There is nonspecific partial opacification of the middle
ear cleft at the level of the mesotympanum and hypotympanum. The
scutum is sharp and there is no definite evidence of ossicular
erosion. Sinus tympani is aerated. Thin or dehiscent bone covering
the superior semicircular canal. Cochlea and semicircular canals are
otherwise unremarkable. Facial nerve demonstrates normal course.
Mastoid air cells are opacified with sclerosis likely reflecting
chronic inflammation.

Left temporal bone:

External auditory canal and tympanic membrane are unremarkable.
Middle ear cleft and ossicles are unremarkable. Thin or dehiscent
bone covering the superior semicircular canal. Cochlea and
semicircular canals are otherwise unremarkable. Tegmen is intact.
Facial nerve demonstrates normal course. Mastoid air cells are
opacified with sclerosis likely reflecting chronic inflammation.

Temporomandibular joints are unremarkable. Limited intracranial
imaging demonstrates no acute abnormality.

[place]
IMPRESSION: Nonspecific partial opacification of the right middle ear without
erosive changes to suggest cholesteatoma. Likely perforated right
tympanic membrane; correlate with exam findings.

Bilateral mastoid opacification.

Thin or dehiscent bone covering bilateral superior semicircular
canals.

## 2022-12-09 ENCOUNTER — Encounter: Payer: Self-pay | Admitting: *Deleted

## 2022-12-10 ENCOUNTER — Ambulatory Visit: Admission: RE | Admit: 2022-12-10 | Payer: 59 | Source: Home / Self Care

## 2022-12-10 ENCOUNTER — Encounter: Admission: RE | Payer: Self-pay | Source: Home / Self Care

## 2022-12-10 SURGERY — COLONOSCOPY WITH PROPOFOL
Anesthesia: General

## 2024-08-10 ENCOUNTER — Other Ambulatory Visit: Payer: Self-pay | Admitting: Specialist

## 2024-08-10 ENCOUNTER — Ambulatory Visit
Admission: RE | Admit: 2024-08-10 | Discharge: 2024-08-10 | Disposition: A | Discharge status: Home / Self Care | Source: Ambulatory Visit | Attending: Specialist | Admitting: Specialist

## 2024-08-10 DIAGNOSIS — R911 Solitary pulmonary nodule: Secondary | ICD-10-CM
